# Patient Record
Sex: Male | Born: 1990 | Race: White | Hispanic: No | Marital: Single | Smoking: Current some day smoker
Health system: Southern US, Community
[De-identification: ages and names within clinical notes are randomized; demographics above are authoritative.]

## PROBLEM LIST (undated history)

## (undated) HISTORY — PX: WISDOM TOOTH EXTRACTION: SHX21

---
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
## 2013-08-16 IMAGING — CR DG CHEST 2V
1 series · 2 of 2 positions shown · non-contrast
Comparison: none

REASON FOR EXAM: CP
COMMENTS:   May transport without cardiac monitor

[Series 1: w chest pa · 0.14mm/px · 2 of 2 slices shown]
[im 1/2]
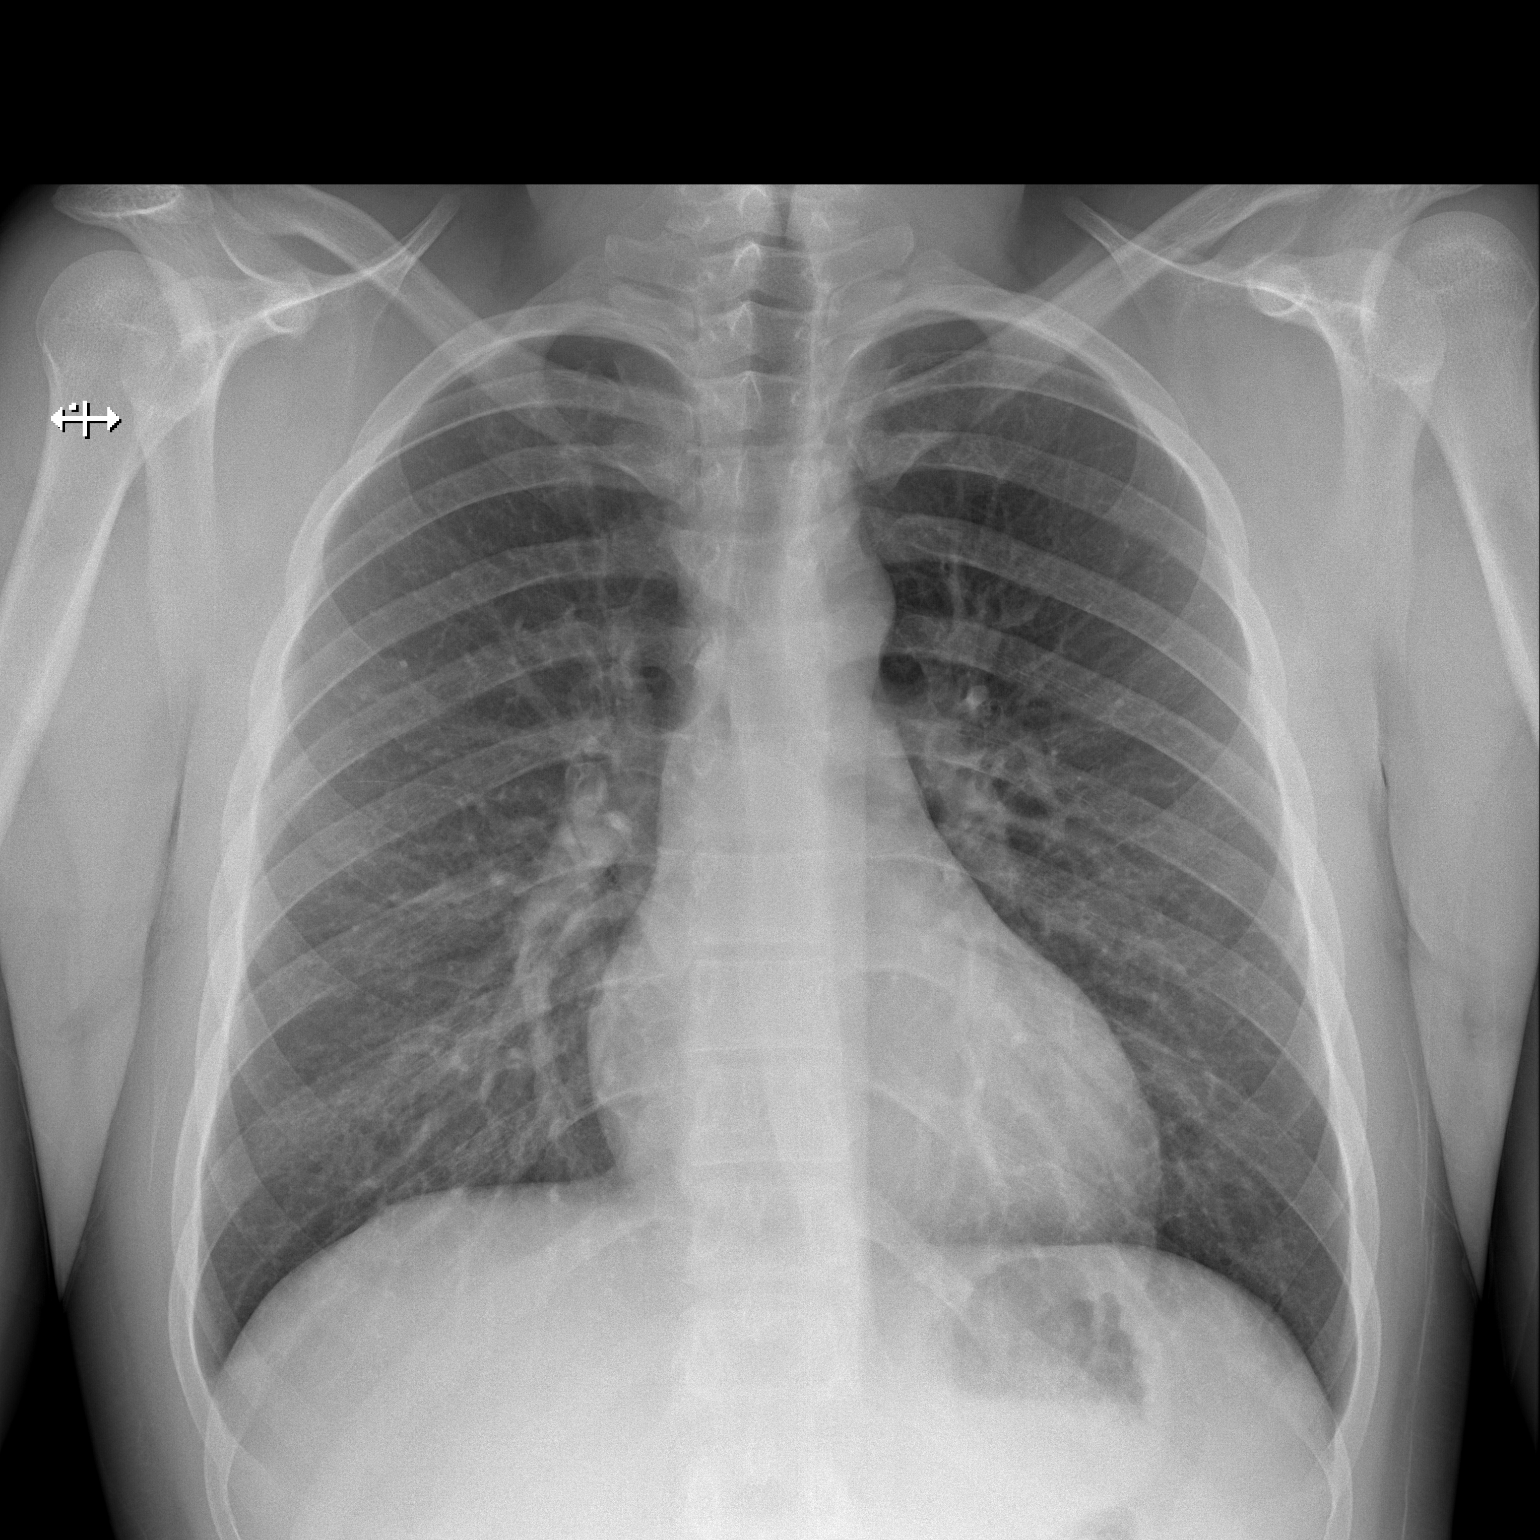
[im 2/2]
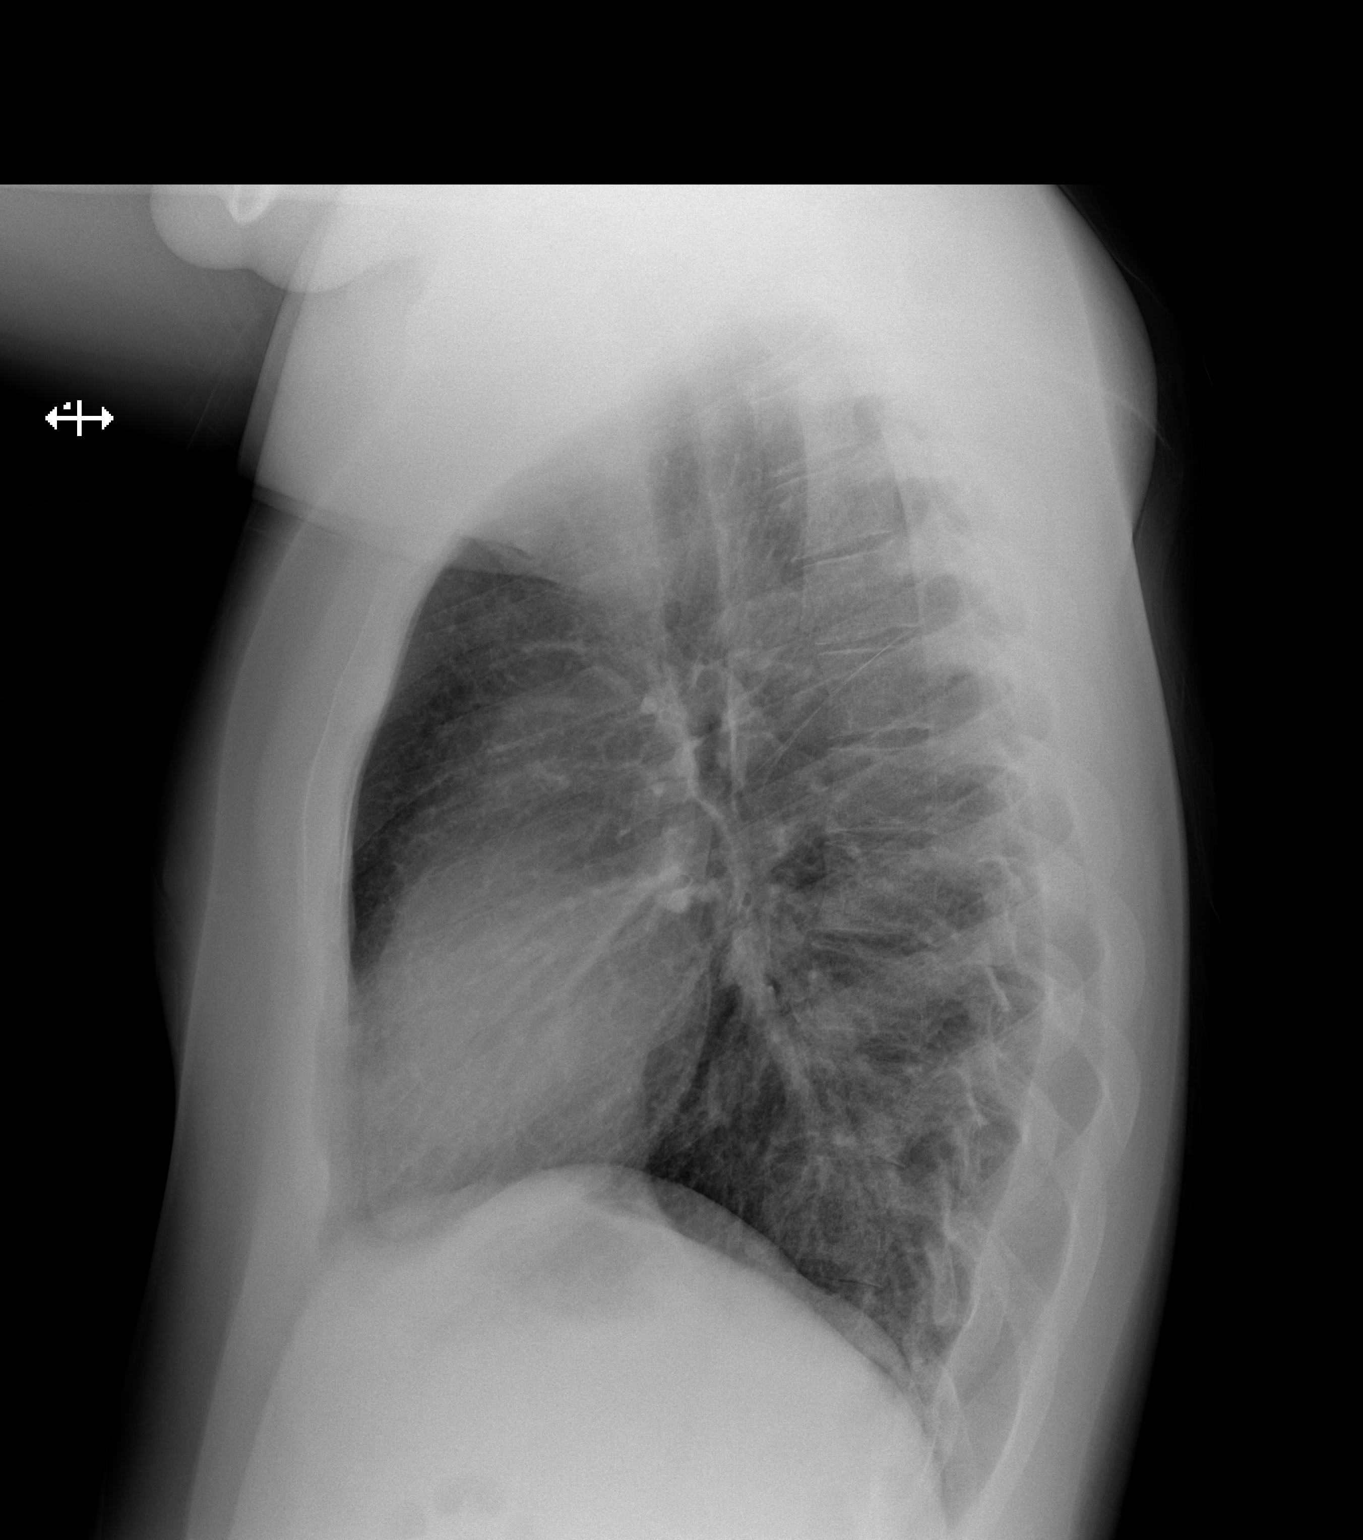

[2 of 2 positions shown; findings below may reference images not displayed]

PROCEDURE:     DXR - DXR CHEST PA (OR AP) AND LATERAL  - July 07, 2011  [DATE]

RESULT:     The patient has no previous examination for comparison.

The lungs are clear. The heart and pulmonary vessels are normal. The bony
and mediastinal structures are unremarkable. There is no effusion. There is
no pneumothorax or evidence of congestive failure.
IMPRESSION: No acute cardiopulmonary disease.

## 2016-01-23 ENCOUNTER — Ambulatory Visit
Admission: EM | Admit: 2016-01-23 | Discharge: 2016-01-23 | Disposition: A | Discharge status: Home / Self Care | Payer: 59 | Attending: Family Medicine | Admitting: Family Medicine

## 2016-01-23 DIAGNOSIS — K0889 Other specified disorders of teeth and supporting structures: Secondary | ICD-10-CM

## 2016-01-23 MED ORDER — PENICILLIN V POTASSIUM 500 MG PO TABS: 500.0000 mg | ORAL_TABLET | Freq: Four times a day (QID) | ORAL | 0 refills | Status: AC

## 2016-01-23 MED ORDER — HYDROCODONE-ACETAMINOPHEN 5-325 MG PO TABS: 1.0000 | ORAL_TABLET | Freq: Three times a day (TID) | ORAL | 0 refills | Status: AC | PRN

## 2016-01-23 NOTE — ED Triage Notes (Signed)
On 7/20 the patient had his wisdom teeth removed, and today he is having extreme pain on the bottom.

## 2016-01-23 NOTE — Discharge Instructions (Signed)
Take medication as prescribed. Drink plenty of fluids.   Follow up closely with your oral surgeon.   Follow up with your primary care physician this week as needed. Return to Urgent care for new or worsening concerns.

## 2016-01-23 NOTE — ED Provider Notes (Signed)
[facility] ____________________________________________  Time seen: Approximately 730 PM  I have reviewed the triage vital signs and the nursing notes.   HISTORY  Chief Complaint Dental Pain    HPI Phillip Ashley is a 25 y.o. male presents with a complaint of dental pain. Patient reports 5 days ago he had 4 wisdom teeth extracted. Patient reports he has been having pain since in the lower areas where there was an teeth were removed. Patient denies any pain to the top areas. Patient reports he feels that the pain is gradually worsening. Patient reports feeling like he is having chills and fever, but denies known fevers. Patient reports he is continue drink fluids well but not eating as much to see the pain. Patient proceeds been taking his home hydrocodone without much improvement of his pain. Patient reports he is now out of hydrocodone. Denies smoking. Denies drinking through straws.   Patient denies any pain radiation. Denies any numbness or tingling sensation. Denies any facial swelling but reports that he feels like a suture areas to bilateral extraction sites are swollen. Denies any ear pain, cough, congestion, recent sickness, neck pain, vision changes, nausea, vomiting or diarrhea.  Patient reports he has not called his dentist or oral surgeon in regards to his complaints.   History reviewed. No pertinent past medical history.  There are no active problems to display for this patient.   Past Surgical History:  Procedure Laterality Date  . WISDOM TOOTH EXTRACTION      Current Outpatient Rx  . Order #: [id number] Class: Historical Med  . Order #: [id number] Class: Print  . Order #: [id number] Class: Normal    Allergies Review of patient's allergies indicates no known allergies.  History reviewed. No pertinent family history.  Social History Social History  Substance Use Topics  . Smoking status: Current Some Day Smoker  . Smokeless tobacco: Current  User    Types: Chew  . Alcohol use Yes    Review of Systems Constitutional: As above.Reports continues to eat and drink foods and fluids well.  Eyes: No visual changes. ENT: No sore throat. Cardiovascular: Denies chest pain. Respiratory: Denies shortness of breath. Gastrointestinal: No abdominal pain.  No nausea, no vomiting.  Genitourinary: Negative for dysuria. Musculoskeletal: Negative for back pain. Skin: Negative for rash. Neurological: Negative for headaches, focal weakness or numbness. 10-point ROS otherwise negative.  ____________________________________________   PHYSICAL EXAM:  VITAL SIGNS: ED Triage Vitals  Enc Vitals Group     BP 01/23/16 1839 135/74     Pulse Rate 01/23/16 1839 67     Resp 01/23/16 1839 18     Temp 01/23/16 1839 98.2 F (36.8 C)     Temp Source 01/23/16 1839 Oral     SpO2 01/23/16 1839 100 %     Weight 01/23/16 1839 230 lb (104.3 kg)     Height 01/23/16 1839  (1.778 m)     Head Circumference --      Peak Flow --      Pain Score 01/23/16 1842 9     Pain Loc --      Pain Edu? --      Excl. in GC? --     Constitutional: Alert and oriented. Well appearing and in no acute distress. Eyes: Conjunctivae are normal. PERRL. EOMI. Head: Atraumatic. No facial swelling noted.  Ears: Bilateral ears no erythema, normal TMs.  Nose: No congestion/rhinnorhea. Mouth/Throat: Mucous membranes are moist.  Oropharynx non-erythematous. Periodontal Exam   Bilateral upper and  lower wisdom teeth extracted sutures visible. Bilateral extraction lower sites with mild gumline erythema with mild to moderate localized swelling and moderate tenderness to palpation. No palpable or visible abscess noted.   Neck: nontender.No stridor.  Hematological/Lymphatic/Immunilogical: No cervical lymphadenopathy. Cardiovascular:   Normal rate, regular rhythm. Grossly normal heart sounds. Good peripheral circulation. Respiratory: Normal respiratory effort.  No  retractions. Musculoskeletal: No lower or upper extremity tenderness nor edema. No midline cervical, thoracic or lumbar tenderness to palpation.  Neurologic:  Normal speech and language. No gross focal neurologic deficits are appreciated. Speech is normal. No gait instability. Skin:  Skin is warm, dry and intact. No rash noted. Psychiatric: Mood and affect are normal. Speech and behavior are normal.  ____________________________________________   LABS (all labs ordered are listed, but only abnormal results are displayed)  Labs Reviewed - No data to display   INITIAL IMPRESSION / ASSESSMENT AND PLAN / ED COURSE  Pertinent labs & imaging results that were available during my care of the patient were reviewed by me and considered in my medical decision making (see chart for details).  Well-appearing patient no acute distress. Presents for complaints of dental pain located at recent extraction sites. Bilateral lower extraction sites with gumline erythema, swelling and localized tenderness. No visualized palpable abscess noted. As with recent surgery as well as swelling and erythema, concern for onset of infection. Discussed with patient as he has not yet contacted his oral surgeon reported to contact oral surgeon tomorrow and schedule close follow-up. Will start patient on oral Pen-VK. Encourage soft food diet, avoidance of triggers, rinse his mouth frequently with warm salt water, also prescription for quantity #3 Norco given. Patient states that he will follow-up with his oral surgeon tomorrow.   Also advised to take the antibiotic until finished. Instructed to return to the Urgent Care or ER  for symptoms that change or worsen or if unable to schedule an appointment. ____________________________________________   FINAL CLINICAL IMPRESSION(S) / ED DIAGNOSES  Final diagnoses:  Pain, dental    Clinical Course       Renford Dills, NP 01/23/16 2121

## 2016-10-02 ENCOUNTER — Ambulatory Visit
Admission: EM | Admit: 2016-10-02 | Discharge: 2016-10-02 | Disposition: A | Discharge status: Home / Self Care | Payer: Worker's Compensation | Attending: Family Medicine | Admitting: Family Medicine

## 2016-10-02 ENCOUNTER — Encounter: Payer: Self-pay | Admitting: *Deleted

## 2016-10-02 DIAGNOSIS — T1592XA Foreign body on external eye, part unspecified, left eye, initial encounter: Secondary | ICD-10-CM

## 2016-10-02 MED ORDER — GENTAMICIN SULFATE 0.3 % OP SOLN: 2.0000 [drp] | Freq: Three times a day (TID) | OPHTHALMIC | 0 refills | Status: AC

## 2016-10-02 MED ORDER — HYDROCODONE-ACETAMINOPHEN 5-325 MG PO TABS: 1.0000 | ORAL_TABLET | Freq: Three times a day (TID) | ORAL | 0 refills | Status: AC | PRN

## 2016-10-02 NOTE — ED Provider Notes (Signed)
[facility]    CSN: [id number] Arrival date & time: 10/02/16  1658     History   Chief Complaint Chief Complaint  Patient presents with  . Foreign Body in Eye    HPI Phillip Ashley is a 26 y.o. male.   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 He states he got into his left eye. He states he was wearing safety goggles. States he agreed his eyes there was oil with steel but he thought he got it all out his work sent him here to be seen and evaluated. He also states that he's had steel metal in his eye before. He was able to get that out using with irrigation. He had wisdom teeth extraction but no other surgeries operation and currently smokes no known drug allergies    Foreign Body in Eye     History reviewed. No pertinent past medical history.  There are no active problems to display for this patient.   Past Surgical History:  Procedure Laterality Date  . WISDOM TOOTH EXTRACTION         Home Medications    Prior to Admission medications   Medication Sig Start Date End Date Taking? Authorizing Provider  gentamicin (GARAMYCIN) 0.3 % ophthalmic solution Place 2 drops into the left eye 3 (three) times daily. Next 3-5 days. 10/02/16   Hassan Rowan, MD  HYDROcodone-acetaminophen (NORCO) 5-325 MG tablet Take 1 tablet by mouth every 8 (eight) hours as needed for moderate pain. Do not take within 6 hours of driving or working heavy machinery or shop machines 10/02/16   Hassan Rowan, MD  HYDROcodone-acetaminophen (NORCO) 7.5-325 MG tablet Take 1 tablet by mouth every 6 (six) hours as needed for moderate pain.    Historical Provider, MD  HYDROcodone-acetaminophen (NORCO/VICODIN) 5-325 MG tablet Take 1 tablet by mouth every 8 (eight) hours as needed for moderate pain or severe pain (do not drive or operate machinery while taking as can cause drowsiness). 01/23/16   Renford Dills, NP    Family History History reviewed. No pertinent family  history.  Social History Social History  Substance Use Topics  . Smoking status: Current Some Day Smoker  . Smokeless tobacco: Current User    Types: Chew  . Alcohol use Yes     Allergies   Patient has no known allergies.   Review of Systems Review of Systems  Eyes: Positive for redness and itching.  All other systems reviewed and are negative.    Physical Exam Triage Vital Signs ED Triage Vitals  Enc Vitals Group     BP 10/02/16 1715 122/72     Pulse --      Resp 10/02/16 1715 16     Temp 10/02/16 1715 98.6 F (37 C)     Temp Source 10/02/16 1715 Oral     SpO2 10/02/16 1715 98 %     Weight 10/02/16 1717 230 lb (104.3 kg)     Height 10/02/16 1717  (1.803 m)     Head Circumference --      Peak Flow --      Pain Score --      Pain Loc --      Pain Edu? --      Excl. in GC? --    No data found.   Updated Vital Signs BP 122/72 (BP Location: Left Arm)   Temp 98.6 F (37 C) (Oral)   Resp 16   Ht  (  1.803 m)   Wt 230 lb (104.3 kg)   SpO2 98%   BMI 32.08 kg/m   Visual Acuity Right Eye Distance: 20/13 Left Eye Distance: 20/15 Bilateral Distance: 20/13  Right Eye Near:   Left Eye Near:    Bilateral Near:     Physical Exam  Constitutional: He is oriented to person, place, and time. He appears well-developed and well-nourished.  HENT:  Head: Normocephalic and atraumatic.  Right Ear: External ear normal.  Left Ear: External ear normal.  Eyes: Conjunctivae, EOM and lids are normal. Pupils are equal, round, and reactive to light. Lids are everted and swept, no foreign bodies found.    Right conjunctiva abrasion about 3:00 eyelid was retracted no foreign objects really covered.  Neck: Normal range of motion. Neck supple.  Pulmonary/Chest: Effort normal.  Musculoskeletal: Normal range of motion. He exhibits no edema.  Neurological: He is alert and oriented to person, place, and time.  Skin: Skin is warm.  Psychiatric: He has a normal mood and  affect.  Vitals reviewed.    UC Treatments / Results  Labs (all labs ordered are listed, but only abnormal results are displayed) Labs Reviewed - No data to display  EKG  EKG Interpretation None       Radiology No results found.  Procedures .Foreign Body Removal Date/Time: 10/02/2016 7:24 PM Performed by: Hassan Rowan Authorized by: Hassan Rowan  Consent: Verbal consent obtained. Body area: eye Anesthesia: local infiltration  Anesthesia: Local Anesthetic: topical anesthetic and tetracaine drops  Sedation: Patient sedated: no Patient restrained: no Patient cooperative: yes Localization method: eyelid eversion, magnification and visualized Removal mechanism: moist cotton swab and irrigation Eye examined with fluorescein. Corneal abrasion size: small Corneal abrasion location: lateral No residual rust ring present. Dressing: antibiotic drops Depth: superficial Complexity: simple 0 objects recovered. Objects recovered: nothing Post-procedure assessment: foreign body not removed Patient tolerance: Patient tolerated the procedure well with no immediate complications Comments: I was irrigated with about 350 mL's of saline were patient couldn't tolerate anymore. The place on gentamicin eyedrops and hydrocodone   (including critical care time)  Medications Ordered in UC Medications - No data to display   Initial Impression / Assessment and Plan / UC Course  I have reviewed the triage vital signs and the nursing notes.  Pertinent labs & imaging results that were available during my care of the patient were reviewed by me and considered in my medical decision making (see chart for details).     Patient was placed on gentamicin eyedrops and hydrocodone. He was checked in the normal chronic drug reporting site he has not been on any recent Vicodin. Final diagnoses:  Foreign body of left external eye, initial encounter    New Prescriptions Discharge Medication  List as of 10/02/2016  7:10 PM    START taking these medications   Details  gentamicin (GARAMYCIN) 0.3 % ophthalmic solution Place 2 drops into the left eye 3 (three) times daily. Next 3-5 days., Starting Wed 10/02/2016, Normal    !! HYDROcodone-acetaminophen (NORCO) 5-325 MG tablet Take 1 tablet by mouth every 8 (eight) hours as needed for moderate pain. Do not take within 6 hours of driving or working heavy machinery or shop machines, Starting Wed 10/02/2016, Normal     !! - Potential duplicate medications found. Please discuss with provider.      Note: This dictation was prepared with Dragon dictation along with smaller phrase technology. Any transcriptional errors that result from this process are unintentional.  Hassan Rowan, MD 10/02/16 [phone]

## 2016-10-02 NOTE — ED Notes (Signed)
Approx 0.9% NACL irrigated through Milner lens to left eye. Pt had difficulty tolerating procedure and was stopped prior to the order per pt request.

## 2016-10-02 NOTE — Discharge Instructions (Signed)
I should be feeling better in the next 24 hours about all morning if not better and definite by Friday a.m. please contact us so ophthalmology referral can be done.

## 2016-10-02 NOTE — ED Triage Notes (Signed)
Possible metal shard in left eye. Pt feels he was able to get it out.

## 2018-06-15 ENCOUNTER — Encounter: Payer: Self-pay | Admitting: Emergency Medicine

## 2018-06-15 ENCOUNTER — Other Ambulatory Visit: Payer: Self-pay

## 2018-06-15 ENCOUNTER — Emergency Department
Admission: EM | Admit: 2018-06-15 | Discharge: 2018-06-15 | Disposition: A | Discharge status: Home / Self Care | Payer: BLUE CROSS/BLUE SHIELD | Attending: Emergency Medicine | Admitting: Emergency Medicine

## 2018-06-15 DIAGNOSIS — F17210 Nicotine dependence, cigarettes, uncomplicated: Secondary | ICD-10-CM | POA: Diagnosis not present

## 2018-06-15 DIAGNOSIS — R55 Syncope and collapse: Secondary | ICD-10-CM | POA: Diagnosis not present

## 2018-06-15 DIAGNOSIS — Z79899 Other long term (current) drug therapy: Secondary | ICD-10-CM | POA: Diagnosis not present

## 2018-06-15 DIAGNOSIS — S161XXA Strain of muscle, fascia and tendon at neck level, initial encounter: Secondary | ICD-10-CM

## 2018-06-15 DIAGNOSIS — M542 Cervicalgia: Secondary | ICD-10-CM | POA: Insufficient documentation

## 2018-06-15 LAB — COMPREHENSIVE METABOLIC PANEL
ALBUMIN: 4.6 g/dL (ref 3.5–5.0)
ALK PHOS: 56 U/L (ref 38–126)
ALT: 64 U/L — ABNORMAL HIGH (ref 0–44)
AST: 37 U/L (ref 15–41)
Anion gap: 8 (ref 5–15)
BILIRUBIN TOTAL: 0.8 mg/dL (ref 0.3–1.2)
BUN: 15 mg/dL (ref 6–20)
CALCIUM: 8.9 mg/dL (ref 8.9–10.3)
CO2: 26 mmol/L (ref 22–32)
Chloride: 103 mmol/L (ref 98–111)
Creatinine, Ser: 0.97 mg/dL (ref 0.61–1.24)
GFR calc Af Amer: >60 mL/min (ref >60)
GLUCOSE: 108 mg/dL — AB (ref 70–99)
Potassium: 4 mmol/L (ref 3.5–5.1)
Sodium: 137 mmol/L (ref 135–145)
TOTAL PROTEIN: 7.2 g/dL (ref 6.5–8.1)

## 2018-06-15 LAB — CBC
HEMATOCRIT: 45.6 % (ref 39.0–52.0)
HEMOGLOBIN: 15.8 g/dL (ref 13.0–17.0)
MCH: 29.8 pg (ref 26.0–34.0)
MCHC: 34.6 g/dL (ref 30.0–36.0)
MCV: 85.9 fL (ref 80.0–100.0)
NRBC: 0 % (ref 0.0–0.2)
Platelets: 291 10*3/uL (ref 150–400)
RBC: 5.31 MIL/uL (ref 4.22–5.81)
RDW: 12.2 % (ref 11.5–15.5)
WBC: 8.9 10*3/uL (ref 4.0–10.5)

## 2018-06-15 LAB — TROPONIN I

## 2018-06-15 NOTE — ED Triage Notes (Signed)
States was at [facility] due to neck pain. He was seen 3 days ago there for same. Denies any injury. States while he was at office went to bathroom because he felt like he vomit and he passed out in bathroom. Arrives pale, to recliner with feet up and color returns to pink.

## 2018-06-15 NOTE — ED Provider Notes (Signed)
[facility] Emergency Department Provider Note  Time seen: 6:12 PM  I have reviewed the triage vital signs and the nursing notes.   HISTORY  Chief Complaint Loss of Consciousness    HPI Phillip Ashley is a 27 y.o. male with no significant past medical history presents to the emergency department after syncopal episode.  According to the patient for the past 2 weeks he has been experiencing neck pain, saw his doctor on Friday and was prescribed Flexeril, states he has been using Flexeril as well as over-the-counter pain medication, states he was not better so he returned to his primary care doctor today.  While in the office patient began feeling nauseated, went to the restroom and had a near syncopal event.  Patient was then sent to the ER for further evaluation.  Here patient states he feels back to normal, continues to have a mild to moderate headache which he states has been an ongoing issue over the past 4 days as well.  Denies any chest pain or shortness of breath at any point.   History reviewed. No pertinent past medical history.  There are no active problems to display for this patient.   Past Surgical History:  Procedure Laterality Date  . WISDOM TOOTH EXTRACTION      Prior to Admission medications   Medication Sig Start Date End Date Taking? Authorizing Provider  gentamicin (GARAMYCIN) 0.3 % ophthalmic solution Place 2 drops into the left eye 3 (three) times daily. Next 3-5 days. 10/02/16   Hassan Rowan, MD  HYDROcodone-acetaminophen (NORCO) 5-325 MG tablet Take 1 tablet by mouth every 8 (eight) hours as needed for moderate pain. Do not take within 6 hours of driving or working heavy machinery or shop machines 10/02/16   Hassan Rowan, MD  HYDROcodone-acetaminophen (NORCO) 7.5-325 MG tablet Take 1 tablet by mouth every 6 (six) hours as needed for moderate pain.    [provider]  HYDROcodone-acetaminophen (NORCO/VICODIN) 5-325 MG tablet Take 1  tablet by mouth every 8 (eight) hours as needed for moderate pain or severe pain (do not drive or operate machinery while taking as can cause drowsiness). 01/23/16   Renford Dills, NP    No Known Allergies  No family history on file.  Social History Social History   Tobacco Use  . Smoking status: Current Some Day Smoker  . Smokeless tobacco: Current User    Types: Chew  Substance Use Topics  . Alcohol use: Yes  . Drug use: No    Review of Systems Constitutional: Negative for fever.  Positive for near syncope. Cardiovascular: Negative for chest pain. Respiratory: Negative for shortness of breath. Gastrointestinal: Negative for abdominal pain.  Positive for nausea, now resolved. Genitourinary: Negative for urinary compaints Musculoskeletal: Negative for musculoskeletal complaints Skin: Negative for skin complaints  Neurological: Negative for headache All other ROS negative  ____________________________________________   PHYSICAL EXAM:  VITAL SIGNS: ED Triage Vitals  Enc Vitals Group     BP 06/15/18 1602 104/62     Pulse Rate 06/15/18 1602 (!) 54     Resp 06/15/18 1602 20     Temp 06/15/18 1602 97.9 F (36.6 C)     Temp Source 06/15/18 1602 Oral     SpO2 06/15/18 1602 100 %     Weight 06/15/18 1605 250 lb (113.4 kg)     Height 06/15/18 1605 5\' 9"  (1.753 m)     Head Circumference --      Peak Flow --  Pain Score 06/15/18 1604 7     Pain Loc --      Pain Edu? --      Excl. in GC? --    Constitutional: Alert and oriented. Well appearing and in no distress. Eyes: Normal exam ENT   Head: Normocephalic and atraumatic.   Mouth/Throat: Mucous membranes are moist. Cardiovascular: Normal rate, regular rhythm. No murmur Respiratory: Normal respiratory effort without tachypnea nor retractions. Breath sounds are clear Gastrointestinal: Soft and nontender. No distention.  Musculoskeletal: Nontender with normal range of motion in all extremities.  Neurologic:   Normal speech and language. No gross focal neurologic deficits Skin:  Skin is warm, dry and intact.  Psychiatric: Mood and affect are normal.   ____________________________________________    EKG  EKG viewed and interpreted by myself shows a sinus bradycardia 53 bpm with a narrow QRS, normal axis, normal intervals, no concerning ST changes.  INITIAL IMPRESSION / ASSESSMENT AND PLAN / ED COURSE  Pertinent labs & imaging results that were available during my care of the patient were reviewed by me and considered in my medical decision making (see chart for details).  Patient presents to the emergency department for a near syncopal episode, also is experiencing headache x4 days and neck pain x2 weeks.  Patient states he is not sure if he injured his neck at work, states the muscle relaxers were not helping.  Patient's syncopal differential at this time would include vasovagal event, orthostatic event, cardiac event, metabolic abnormality, anemia, dehydration.  Overall patient's labs are reassuring, H&H is normal, troponin negative, EKG is reassuring.  Suspect likely vasovagal event.  However given the patient's complaint of ongoing neck pain and headache I did discuss further work-up including CT imaging of the head and neck.  Patient states he believes it is just a pulled muscle and wants to give it more time.  I believe this is a perfectly reasonable plan given no concerning findings on examination.  Patient will continue to use the muscle relaxer as prescribed by his doctor.  I did discuss cervical strain oftentimes will take 1 month or more to fully improve.   ____________________________________________   FINAL CLINICAL IMPRESSION(S) / ED DIAGNOSES  Vasovagal syncope Neck pain    Minna AntisPaduchowski, Areyana Leoni, MD 06/15/18 1816

## 2020-04-07 ENCOUNTER — Ambulatory Visit: Payer: Self-pay | Admitting: *Deleted

## 2020-04-07 NOTE — Telephone Encounter (Signed)
Pt called with complaints of numbness in hands and feet; the feet numbness has been intermittent since  04/05/20, but became constant on 04/06/20; his hand numbness has been intermittent for "several months" and became constant on 10.7/21; recommendations made per triage nurse protocol; the pt says  was previously seen at [facility] "some years ago"; no records found; the pt will go to urgent care/ED and will call back to re-establish care with practice. Reason for Disposition . [1] Tingling (e.g., pins and needles) of the face, arm / hand, or leg / foot on one side of the body AND [2] present now (Exceptions: chronic/recurrent symptom lasting > 4 weeks or tingling from known cause, such as: bumped elbow, carpal tunnel syndrome, pinched nerve, frostbite)  Answer Assessment - Initial Assessment Questions 1. SYMPTOM: "What is the main symptom you are concerned about?" (e.g., weakness, numbness)     Bilateral foot numbness 2. ONSET: "When did this start?" (minutes, hours, days; while sleeping)     04/05/20 3. LAST NORMAL: "When was the last time you were normal (no symptoms)?"     04/05/20 4. PATTERN "Does this come and go, or has it been constant since it started?"  "Is it present now?"    initially intermittent; now constant on 04/06/20 5. CARDIAC SYMPTOMS: "Have you had any of the following symptoms: chest pain, difficulty breathing, palpitations?"     no 6. NEUROLOGIC SYMPTOMS: "Have you had any of the following symptoms: headache, dizziness, vision loss, double vision, changes in speech, unsteady on your feet?"     no 7. OTHER SYMPTOMS: "Do you have any other symptoms?"     no 8. PREGNANCY: "Is there any chance you are pregnant?" "When was your last menstrual period?"    n/a  Protocols used: NEUROLOGIC DEFICIT-A-AH
